# Patient Record
Sex: Female | Born: 1955 | Race: White | Hispanic: No | Smoking: Never smoker
Health system: Southern US, Community
[De-identification: ages and names within clinical notes are randomized; demographics above are authoritative.]

## PROBLEM LIST (undated history)

## (undated) DIAGNOSIS — F32A Depression, unspecified: Secondary | ICD-10-CM

## (undated) DIAGNOSIS — F329 Major depressive disorder, single episode, unspecified: Secondary | ICD-10-CM

## (undated) HISTORY — PX: TONSILLECTOMY: SUR1361

## (undated) HISTORY — PX: SALPINGOOPHORECTOMY: SHX82

---

## 1998-06-25 ENCOUNTER — Other Ambulatory Visit: Admission: RE | Admit: 1998-06-25 | Discharge: 1998-06-25 | Payer: Self-pay | Admitting: *Deleted

## 1999-07-22 ENCOUNTER — Other Ambulatory Visit: Admission: RE | Admit: 1999-07-22 | Discharge: 1999-07-22 | Payer: Self-pay | Admitting: *Deleted

## 2000-08-10 ENCOUNTER — Other Ambulatory Visit: Admission: RE | Admit: 2000-08-10 | Discharge: 2000-08-10 | Payer: Self-pay | Admitting: *Deleted

## 2001-10-24 ENCOUNTER — Other Ambulatory Visit: Admission: RE | Admit: 2001-10-24 | Discharge: 2001-10-24 | Payer: Self-pay | Admitting: *Deleted

## 2002-10-24 ENCOUNTER — Other Ambulatory Visit: Admission: RE | Admit: 2002-10-24 | Discharge: 2002-10-24 | Payer: Self-pay | Admitting: *Deleted

## 2003-04-08 ENCOUNTER — Encounter: Admission: RE | Admit: 2003-04-08 | Discharge: 2003-04-08 | Payer: Self-pay | Admitting: *Deleted

## 2003-11-09 ENCOUNTER — Other Ambulatory Visit: Admission: RE | Admit: 2003-11-09 | Discharge: 2003-11-09 | Payer: Self-pay | Admitting: *Deleted

## 2008-05-27 IMAGING — US US ABDOMEN COMPLETE
1 series · 13 of 25 positions shown · non-contrast
Comparison: NONE

CLINICAL DATA: Right upper quadrant pain. 

ABDOMINAL ULTRASOUND

[Series 1: us abd · 0.30mm/px · 13 of 65 slices shown]
[im 1/65]
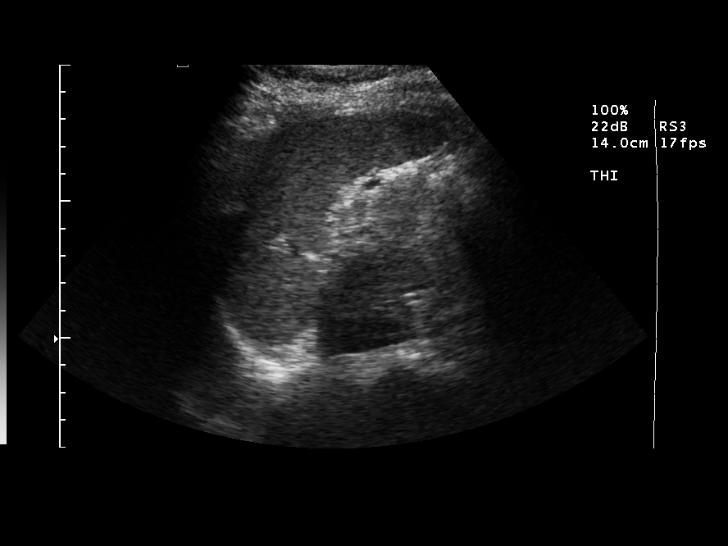
[im 6/65]
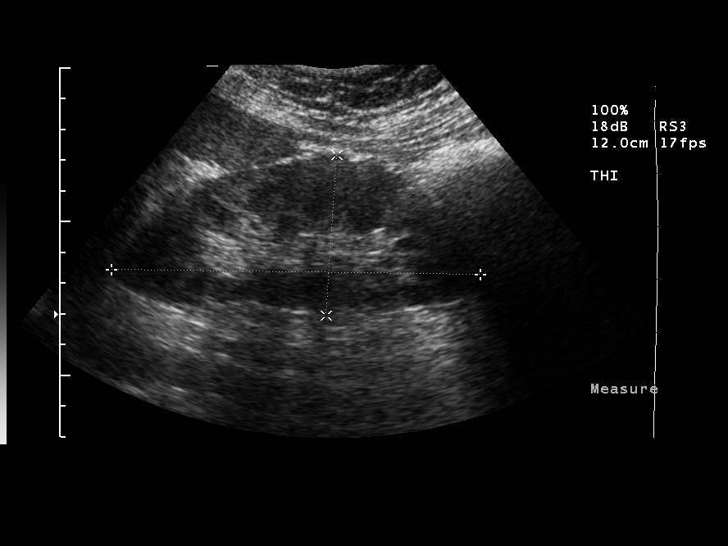
[im 11/65]
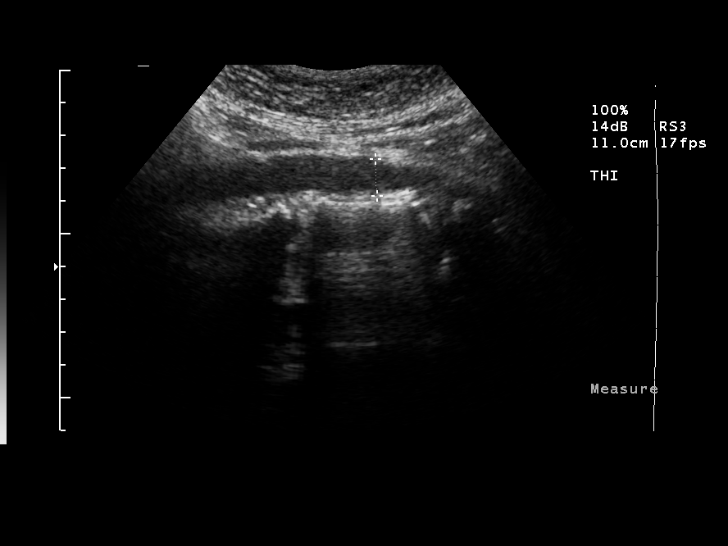
[im 17/65]
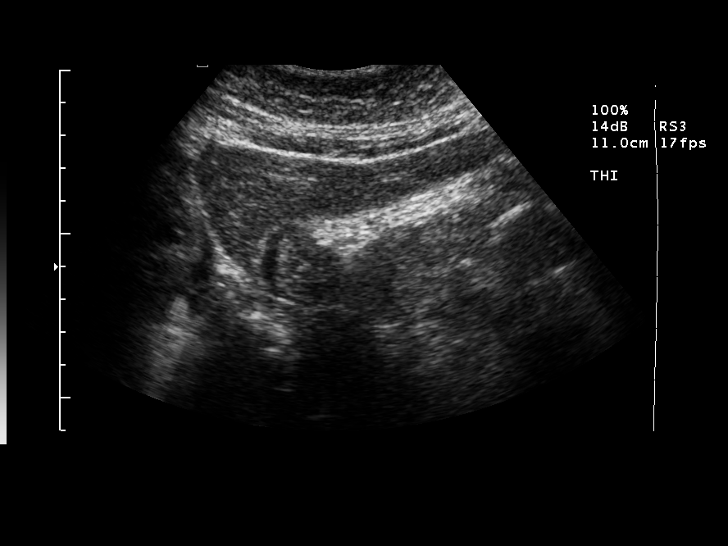
[im 22/65]
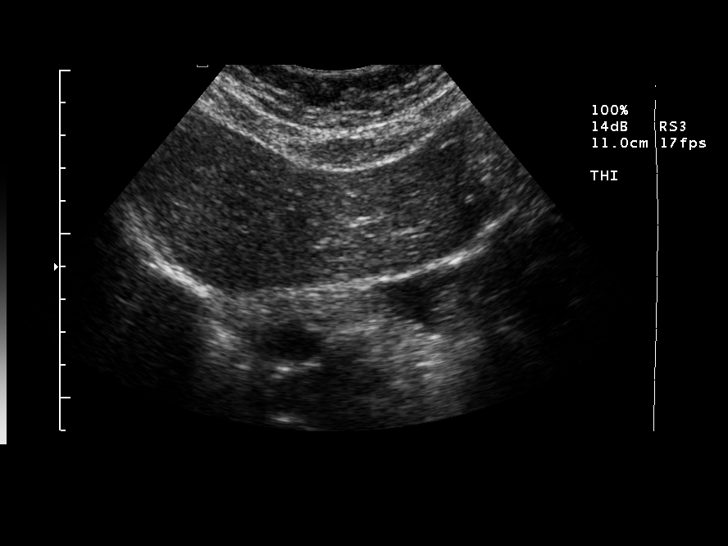
[im 27/65]
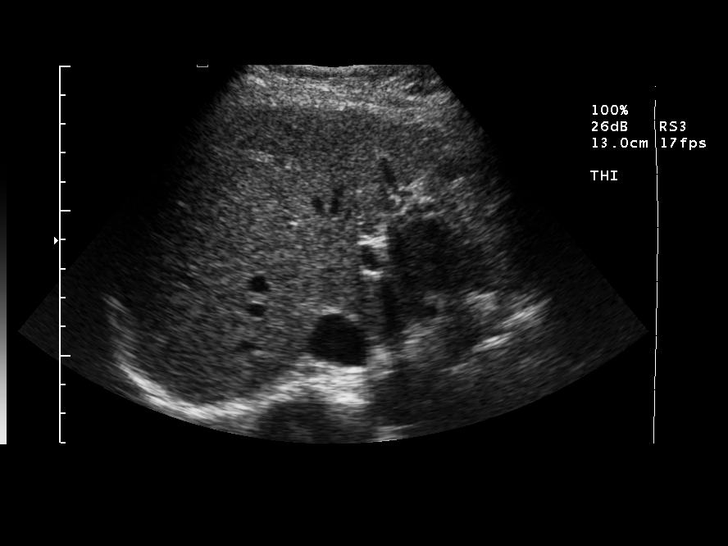
[im 33/65]
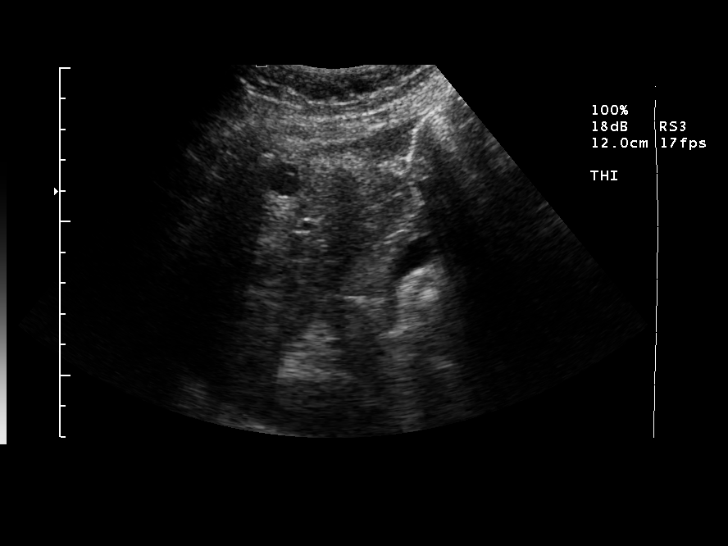
[im 38/65]
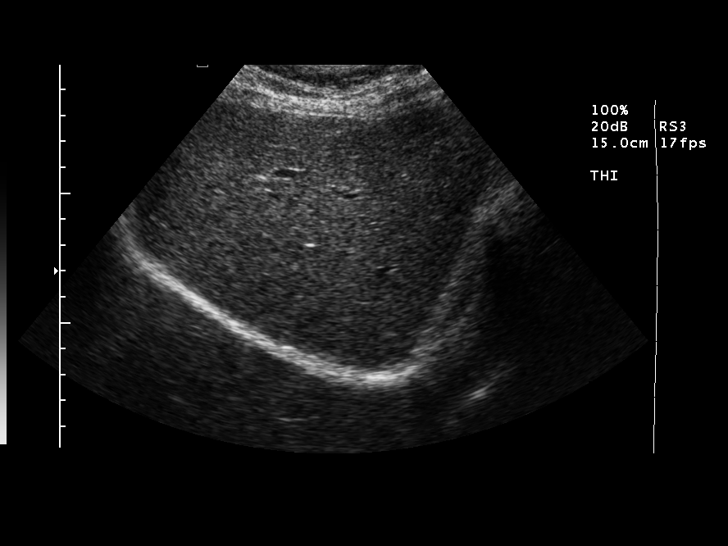
[im 43/65]
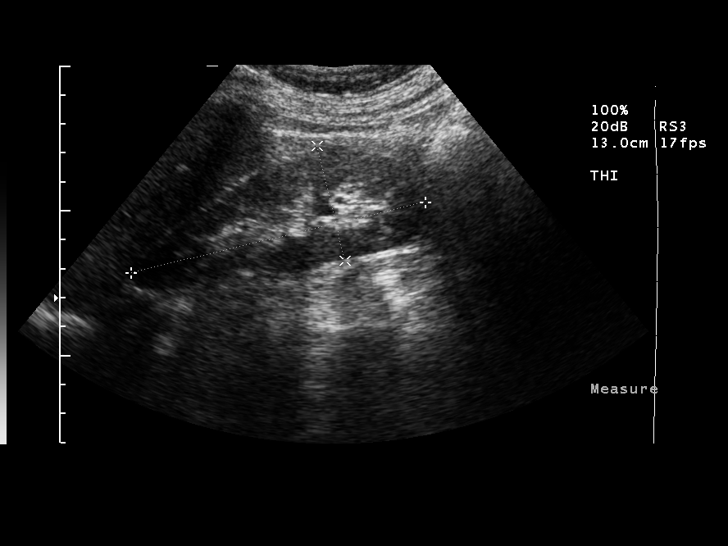
[im 49/65]
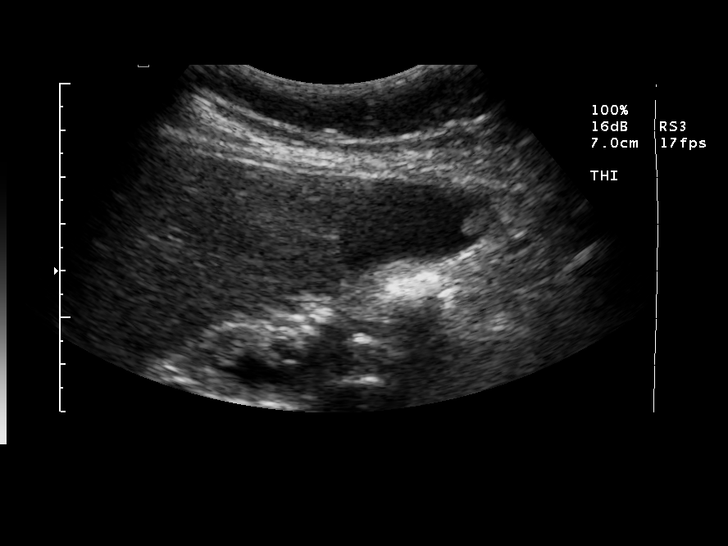
[im 54/65]
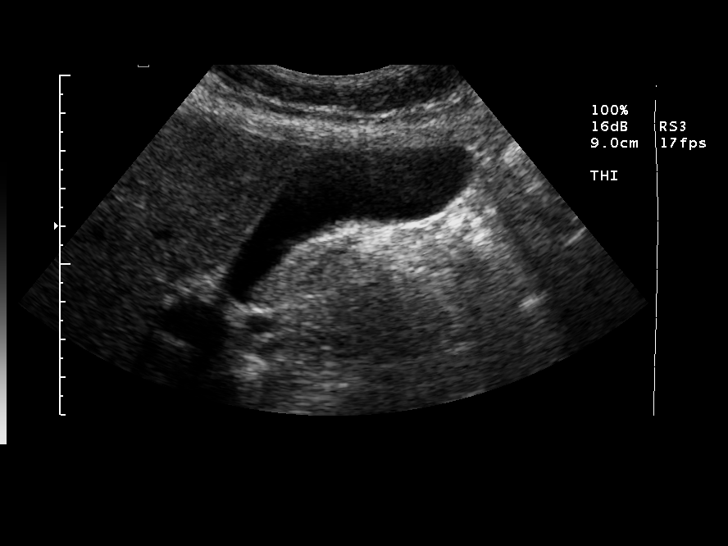
[im 59/65]
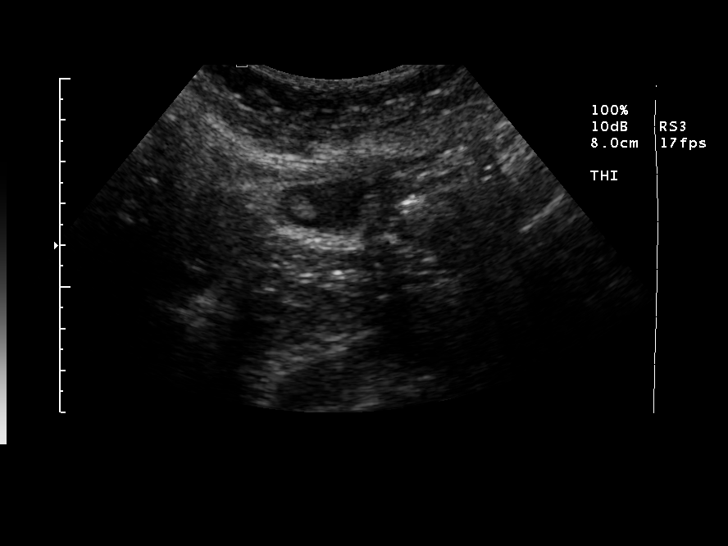
[im 65/65]
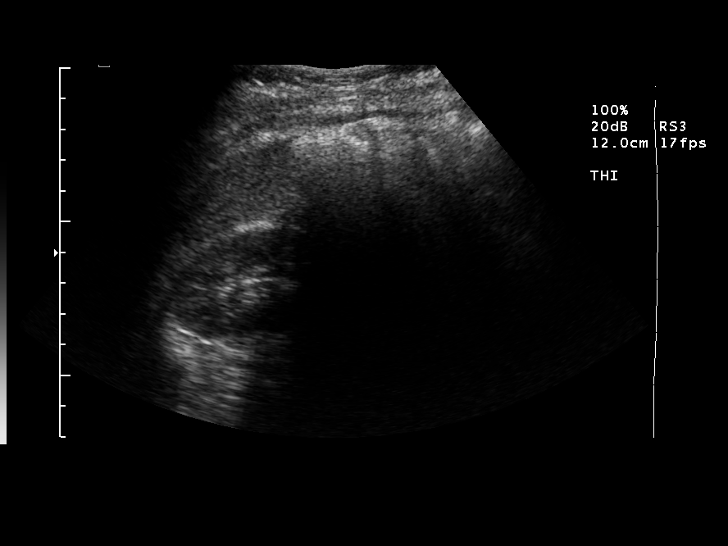

[13 of 25 positions shown; findings below may reference images not displayed]

FINDINGS: Examination of the liver demonstrates normal size and 
echogenicity. Located in the posterior segment of the right lobe 
of the liver, there is a hyperechoic solid nodule measuring 1.0 x 
1.0 x 1.1 cm. Appearances favor a benign hemangioma. Located in 
the medial segment of the left lobe, there is a hypoechoic nodule 
which exhibits acoustic enhancement. Appearances favor a cyst, 
although optimal visualization was difficult due to location. This 
measures 1.2 x 1.1 x 1.7 cm. The pancreas is normal in appearance. 
The abdominal aorta is normal in caliber and echo appearance with 
AP measurements of 1.7 cm proximally, mid 1.2 cm, and distally
cm. Both kidneys are normal in size and echo appearance. 
Examination of the gallbladder demonstrates a wall-adherent 
echogenicity in the gallbladder fundus, measuring 1.0 x 0.6 x
cm. A similar appearing wall-adherent echogenicity is noted near 
the neck of the gallbladder, measuring 0.34 cm. Appearances favor 
polyps. The common duct is non-dilated measuring 0.44 cm. The 
spleen measures 10.3 cm with normal echogenicity.
IMPRESSION: 1-cm hyperechoic right lobe hepatic nodule most 
consistent with a hemangioma. Probable left lobe hepatic cyst. 
Probable gallbladder polyps. I recommend a follow-up study of 
these findings with ultrasound re-evaluation in 6 months. Watanabe 
Kants Za, M.D. electronically reviewed on 11/13/2007 Dict Date: 
11/12/2007  Tran Date: 11/13/2007 CAV  [REDACTED]

## 2008-11-09 ENCOUNTER — Encounter: Admission: RE | Admit: 2008-11-09 | Discharge: 2008-11-09 | Payer: Self-pay | Admitting: Obstetrics and Gynecology

## 2009-11-15 ENCOUNTER — Encounter: Admission: RE | Admit: 2009-11-15 | Discharge: 2009-11-15 | Payer: Self-pay | Admitting: Obstetrics and Gynecology

## 2010-12-08 ENCOUNTER — Other Ambulatory Visit: Payer: Self-pay | Admitting: Obstetrics & Gynecology

## 2010-12-08 DIAGNOSIS — Z1231 Encounter for screening mammogram for malignant neoplasm of breast: Secondary | ICD-10-CM

## 2010-12-15 ENCOUNTER — Ambulatory Visit
Admission: RE | Admit: 2010-12-15 | Discharge: 2010-12-15 | Disposition: A | Discharge status: Home / Self Care | Payer: BC Managed Care – PPO | Source: Ambulatory Visit | Attending: Obstetrics & Gynecology | Admitting: Obstetrics & Gynecology

## 2010-12-15 DIAGNOSIS — Z1231 Encounter for screening mammogram for malignant neoplasm of breast: Secondary | ICD-10-CM

## 2011-12-12 ENCOUNTER — Other Ambulatory Visit: Payer: Self-pay | Admitting: Obstetrics & Gynecology

## 2011-12-12 DIAGNOSIS — Z1231 Encounter for screening mammogram for malignant neoplasm of breast: Secondary | ICD-10-CM

## 2011-12-22 ENCOUNTER — Ambulatory Visit
Admission: RE | Admit: 2011-12-22 | Discharge: 2011-12-22 | Disposition: A | Discharge status: Home / Self Care | Payer: BC Managed Care – PPO | Source: Ambulatory Visit | Attending: Obstetrics & Gynecology | Admitting: Obstetrics & Gynecology

## 2011-12-22 DIAGNOSIS — Z1231 Encounter for screening mammogram for malignant neoplasm of breast: Secondary | ICD-10-CM

## 2012-12-20 ENCOUNTER — Other Ambulatory Visit: Payer: Self-pay

## 2012-12-20 DIAGNOSIS — Z1231 Encounter for screening mammogram for malignant neoplasm of breast: Secondary | ICD-10-CM

## 2013-01-09 ENCOUNTER — Ambulatory Visit
Admission: RE | Admit: 2013-01-09 | Discharge: 2013-01-09 | Disposition: A | Discharge status: Home / Self Care | Payer: BC Managed Care – PPO | Source: Ambulatory Visit

## 2013-01-09 DIAGNOSIS — Z1231 Encounter for screening mammogram for malignant neoplasm of breast: Secondary | ICD-10-CM

## 2013-01-16 ENCOUNTER — Ambulatory Visit: Payer: BC Managed Care – PPO

## 2013-05-07 ENCOUNTER — Other Ambulatory Visit: Payer: Self-pay | Admitting: Internal Medicine

## 2013-05-12 ENCOUNTER — Ambulatory Visit
Admission: RE | Admit: 2013-05-12 | Discharge: 2013-05-12 | Disposition: A | Discharge status: Home / Self Care | Payer: BC Managed Care – PPO | Source: Ambulatory Visit | Attending: Internal Medicine | Admitting: Internal Medicine

## 2013-12-05 ENCOUNTER — Other Ambulatory Visit: Payer: Self-pay

## 2013-12-05 DIAGNOSIS — Z1231 Encounter for screening mammogram for malignant neoplasm of breast: Secondary | ICD-10-CM

## 2013-12-05 DIAGNOSIS — Z853 Personal history of malignant neoplasm of breast: Secondary | ICD-10-CM

## 2013-12-05 DIAGNOSIS — Z803 Family history of malignant neoplasm of breast: Secondary | ICD-10-CM

## 2014-02-06 ENCOUNTER — Ambulatory Visit
Admission: RE | Admit: 2014-02-06 | Discharge: 2014-02-06 | Disposition: A | Discharge status: Home / Self Care | Payer: BC Managed Care – PPO | Source: Ambulatory Visit

## 2014-02-06 DIAGNOSIS — Z803 Family history of malignant neoplasm of breast: Secondary | ICD-10-CM

## 2014-02-06 DIAGNOSIS — Z1231 Encounter for screening mammogram for malignant neoplasm of breast: Secondary | ICD-10-CM

## 2015-05-27 ENCOUNTER — Other Ambulatory Visit: Payer: Self-pay | Admitting: Gastroenterology

## 2015-08-05 ENCOUNTER — Encounter (HOSPITAL_COMMUNITY): Payer: Self-pay | Admitting: *Deleted

## 2015-08-16 ENCOUNTER — Encounter (HOSPITAL_COMMUNITY): Payer: Self-pay

## 2015-08-16 ENCOUNTER — Encounter (HOSPITAL_COMMUNITY)
Admission: RE | Disposition: A | Discharge status: Home / Self Care | Payer: Self-pay | Source: Ambulatory Visit | Attending: Gastroenterology

## 2015-08-16 ENCOUNTER — Ambulatory Visit (HOSPITAL_COMMUNITY): Payer: BC Managed Care – PPO | Admitting: Anesthesiology

## 2015-08-16 ENCOUNTER — Ambulatory Visit (HOSPITAL_COMMUNITY)
Admission: RE | Admit: 2015-08-16 | Discharge: 2015-08-16 | Disposition: A | Discharge status: Home / Self Care | Payer: BC Managed Care – PPO | Source: Ambulatory Visit | Attending: Gastroenterology | Admitting: Gastroenterology

## 2015-08-16 DIAGNOSIS — Z1211 Encounter for screening for malignant neoplasm of colon: Secondary | ICD-10-CM | POA: Insufficient documentation

## 2015-08-16 HISTORY — DX: Major depressive disorder, single episode, unspecified: F32.9

## 2015-08-16 HISTORY — PX: COLONOSCOPY WITH PROPOFOL: SHX5780

## 2015-08-16 HISTORY — DX: Depression, unspecified: F32.A

## 2015-08-16 SURGERY — COLONOSCOPY WITH PROPOFOL
Anesthesia: Monitor Anesthesia Care

## 2015-08-16 MED ORDER — PROPOFOL 10 MG/ML IV BOLUS
INTRAVENOUS | Status: AC
Start: 2015-08-16 — End: 2015-08-16
  Filled 2015-08-16: qty 20

## 2015-08-16 MED ORDER — PROPOFOL 10 MG/ML IV BOLUS
INTRAVENOUS | Status: DC | PRN
  Administered 2015-08-16 (×3): 40 mg via INTRAVENOUS
  Administered 2015-08-16: 20 mg via INTRAVENOUS
  Administered 2015-08-16: 40 mg via INTRAVENOUS
  Administered 2015-08-16: 20 mg via INTRAVENOUS
  Administered 2015-08-16: 40 mg via INTRAVENOUS
  Administered 2015-08-16 (×2): 20 mg via INTRAVENOUS

## 2015-08-16 MED ORDER — LACTATED RINGERS IV SOLN
INTRAVENOUS | Status: DC
  Administered 2015-08-16: 1000 mL via INTRAVENOUS

## 2015-08-16 SURGICAL SUPPLY — 21 items

## 2015-08-16 NOTE — Discharge Instructions (Signed)
Colonoscopy, Care After °Refer to this sheet in the next few weeks. These instructions provide you with information on caring for yourself after your procedure. Your health care provider may also give you more specific instructions. Your treatment has been planned according to current medical practices, but problems sometimes occur. Call your health care provider if you have any problems or questions after your procedure. °WHAT TO EXPECT AFTER THE PROCEDURE  °After your procedure, it is typical to have the following: °· A small amount of blood in your stool. °· Moderate amounts of gas and mild abdominal cramping or bloating. °HOME CARE INSTRUCTIONS °· Do not drive, operate machinery, or sign important documents for 24 hours. °· You may shower and resume your regular physical activities, but move at a slower pace for the first 24 hours. °· Take frequent rest periods for the first 24 hours. °· Walk around or put a warm pack on your abdomen to help reduce abdominal cramping and bloating. °· Drink enough fluids to keep your urine clear or pale yellow. °· You may resume your normal diet as instructed by your health care provider. Avoid heavy or fried foods that are hard to digest. °· Avoid drinking alcohol for 24 hours or as instructed by your health care provider. °· Only take over-the-counter or prescription medicines as directed by your health care provider. °· If a tissue sample (biopsy) was taken during your procedure: °¨ Do not take aspirin or blood thinners for 7 days, or as instructed by your health care provider. °¨ Do not drink alcohol for 7 days, or as instructed by your health care provider. °¨ Eat soft foods for the first 24 hours. °SEEK MEDICAL CARE IF: °You have persistent spotting of blood in your stool 2-3 days after the procedure. °SEEK IMMEDIATE MEDICAL CARE IF: °· You have more than a small spotting of blood in your stool. °· You pass large blood clots in your stool. °· Your abdomen is swollen  (distended). °· You have nausea or vomiting. °· You have a fever. °· You have increasing abdominal pain that is not relieved with medicine. °  °This information is not intended to replace advice given to you by your health care provider. Make sure you discuss any questions you have with your health care provider. °  °Document Released: 05/23/2004 Document Revised: 07/30/2013 Document Reviewed: 06/16/2013 °Elsevier Interactive Patient Education ©2016 [employer] ° °

## 2015-08-16 NOTE — H&P (Signed)
  Procedure: Screening colonoscopy. 03/01/2005 normal screening colonoscopy performed  History: The patient is a 59 year old female born 1956/06/09. She is scheduled to undergo a repeat screening colonoscopy today.  Past medical history: Depression. Allergic rhinitis. Benign hepatic cyst. Tonsillectomy. Right ovary removed surgically.  Medication allergies: Codeine  Exam: The patient is alert and lying comfortably on the endoscopy stretcher. Abdomen is soft and nontender to palpation. Cardiac exam reveals a regular rhythm. Lungs are clear to auscultation.  Plan: Proceed with screening colonoscopy. The patient does not want to receive intravenous blood products

## 2015-08-16 NOTE — Transfer of Care (Signed)
Immediate Anesthesia Transfer of Care Note  Patient: Victoria SchimkeDeborah P Cannon  Procedure(s) Performed: Procedure(s): COLONOSCOPY WITH PROPOFOL (N/A)  Patient Location: PACU and Endoscopy Unit  Anesthesia Type:MAC  Level of Consciousness: awake, oriented, patient cooperative, lethargic and responds to stimulation  Airway & Oxygen Therapy: Patient Spontanous Breathing and Patient connected to face mask oxygen  Post-op Assessment: Report given to RN, Post -op Vital signs reviewed and stable and Patient moving all extremities  Post vital signs: Reviewed and stable  Last Vitals:  Filed Vitals:   08/16/15 0746  BP: 101/45  Pulse: 64  Temp: 36.9 C  Resp: 12    Complications: No apparent anesthesia complications

## 2015-08-16 NOTE — Anesthesia Postprocedure Evaluation (Signed)
  Anesthesia Post-op Note  Patient: Victoria SchimkeDeborah P Toohey  Procedure(s) Performed: Procedure(s) (LRB): COLONOSCOPY WITH PROPOFOL (N/A)  Patient Location: PACU  Anesthesia Type: MAC  Level of Consciousness: awake and alert   Airway and Oxygen Therapy: Patient Spontanous Breathing  Post-op Pain: mild  Post-op Assessment: Post-op Vital signs reviewed, Patient's Cardiovascular Status Stable, Respiratory Function Stable, Patent Airway and No signs of Nausea or vomiting  Last Vitals:  Filed Vitals:   08/16/15 0950  BP: 105/51  Pulse: 73  Temp:   Resp: 23    Post-op Vital Signs: stable   Complications: No apparent anesthesia complications

## 2015-08-16 NOTE — Anesthesia Preprocedure Evaluation (Addendum)
Anesthesia Evaluation  Patient identified by MRN, date of birth, ID band Patient awake    Reviewed: Allergy & Precautions, NPO status , Patient's Chart, lab work & pertinent test results  Airway Mallampati: II  TM Distance: >3 FB Neck ROM: Full    Dental no notable dental hx.    Pulmonary neg pulmonary ROS,    Pulmonary exam normal breath sounds clear to auscultation       Cardiovascular negative cardio ROS Normal cardiovascular exam Rhythm:Regular Rate:Normal     Neuro/Psych PSYCHIATRIC DISORDERS Depression negative neurological ROS     GI/Hepatic negative GI ROS, Neg liver ROS,   Endo/Other  negative endocrine ROS  Renal/GU negative Renal ROS  negative genitourinary   Musculoskeletal negative musculoskeletal ROS (+)   Abdominal   Peds negative pediatric ROS (+)  Hematology negative hematology ROS (+)   Anesthesia Other Findings   Reproductive/Obstetrics negative OB ROS                             Anesthesia Physical Anesthesia Plan  ASA: II  Anesthesia Plan: MAC   Post-op Pain Management:    Induction: Intravenous  Airway Management Planned: Natural Airway  Additional Equipment:   Intra-op Plan:   Post-operative Plan:   Informed Consent: I have reviewed the patients History and Physical, chart, labs and discussed the procedure including the risks, benefits and alternatives for the proposed anesthesia with the patient or authorized representative who has indicated his/her understanding and acceptance.   Dental advisory given  Plan Discussed with: CRNA  Anesthesia Plan Comments: ([employer]. Blood refusal paperwork on chart.)       Anesthesia Quick Evaluation

## 2015-08-16 NOTE — Op Note (Signed)
Procedure: Screening colonoscopy. Normal screening colonoscopy performed on 03/01/2005  Endoscopist: Danise EdgeMartin Iretta Mangrum  Premedication: Propofol administered by anesthesia  Procedure: The patient was placed in the left lateral decubitus position. Anal inspection and digital rectal exam were normal. The Pentax pediatric colonoscope was introduced into the rectum and advanced to the cecum. A normal-appearing appendiceal orifice and ileocecal valve were identified. Colonic preparation for the exam today was good. Withdrawal time was 10 minutes  Rectum. Normal. Retroflexed view of the distal rectum was normal  Sigmoid colon and descending colon. Normal  Splenic flexure. Normal  Transverse colon. Normal  Hepatic flexure. Normal  Ascending colon. Normal  Cecum and ileocecal valve. Normal  Assessment: Normal screening colonoscopy  Recommendation: Schedule repeat screening colonoscopy in 10 years

## 2015-08-17 ENCOUNTER — Encounter (HOSPITAL_COMMUNITY): Payer: Self-pay | Admitting: Gastroenterology

## 2016-03-08 ENCOUNTER — Other Ambulatory Visit: Payer: Self-pay | Admitting: Obstetrics & Gynecology

## 2016-03-08 DIAGNOSIS — R928 Other abnormal and inconclusive findings on diagnostic imaging of breast: Secondary | ICD-10-CM

## 2016-03-14 ENCOUNTER — Ambulatory Visit
Admission: RE | Admit: 2016-03-14 | Discharge: 2016-03-14 | Disposition: A | Discharge status: Home / Self Care | Payer: BC Managed Care – PPO | Source: Ambulatory Visit | Attending: Obstetrics & Gynecology | Admitting: Obstetrics & Gynecology

## 2016-03-14 DIAGNOSIS — R928 Other abnormal and inconclusive findings on diagnostic imaging of breast: Secondary | ICD-10-CM

## 2019-01-07 ENCOUNTER — Other Ambulatory Visit: Payer: Self-pay | Admitting: Internal Medicine

## 2019-01-07 DIAGNOSIS — M8580 Other specified disorders of bone density and structure, unspecified site: Secondary | ICD-10-CM

## 2019-03-12 ENCOUNTER — Other Ambulatory Visit: Payer: BC Managed Care – PPO

## 2019-05-12 ENCOUNTER — Ambulatory Visit
Admission: RE | Admit: 2019-05-12 | Discharge: 2019-05-12 | Disposition: A | Discharge status: Home / Self Care | Payer: Medicare Other | Source: Ambulatory Visit | Attending: Internal Medicine | Admitting: Internal Medicine

## 2019-05-12 ENCOUNTER — Other Ambulatory Visit: Payer: Self-pay

## 2019-05-12 DIAGNOSIS — M8580 Other specified disorders of bone density and structure, unspecified site: Secondary | ICD-10-CM

## 2019-12-06 ENCOUNTER — Ambulatory Visit: Payer: Medicare PPO | Attending: Internal Medicine

## 2019-12-06 DIAGNOSIS — Z23 Encounter for immunization: Secondary | ICD-10-CM | POA: Insufficient documentation

## 2019-12-06 NOTE — Progress Notes (Signed)
   Covid-19 Vaccination Clinic  Name:  Victoria Cannon    MRN: [id number] DOB: [birth year]  12/06/2019  Victoria Cannon was observed post Covid-19 immunization for 15 minutes without incidence. She was provided with Vaccine Information Sheet and instruction to access the V-Safe system.   Victoria Cannon was instructed to call 911 with any severe reactions post vaccine: Marland Kitchen Difficulty breathing  . Swelling of your face and throat  . A fast heartbeat  . A bad rash all over your body  . Dizziness and weakness    Immunizations Administered    Name Date Dose VIS Date Route   Pfizer COVID-19 Vaccine 12/06/2019  2:24 PM 0.3 mL 10/03/2019 Intramuscular   Manufacturer: [employer], [employer]   Lot: [id number]   NDC: [address]-3

## 2019-12-27 ENCOUNTER — Ambulatory Visit: Payer: Medicare PPO | Attending: Internal Medicine

## 2019-12-27 DIAGNOSIS — Z23 Encounter for immunization: Secondary | ICD-10-CM

## 2019-12-27 NOTE — Progress Notes (Signed)
   Covid-19 Vaccination Clinic  Name:  Victoria Cannon    MRN: [id number] DOB: 1956/01/02  12/27/2019  [name] was observed post Covid-19 immunization for 15 minutes without incident. She was provided with Vaccine Information Sheet and instruction to access the V-Safe system.   [name] was instructed to call 911 with any severe reactions post vaccine: Marland Kitchen Difficulty breathing  . Swelling of face and throat  . A fast heartbeat  . A bad rash all over body  . Dizziness and weakness   Immunizations Administered    Name Date Dose VIS Date Route   Pfizer COVID-19 Vaccine 12/27/2019  2:21 PM 0.3 mL 10/03/2019 Intramuscular   Manufacturer: [employer], [employer]   Lot: [id number]   NDC: [address]-3

## 2020-08-07 ENCOUNTER — Ambulatory Visit: Payer: Medicare PPO | Attending: Internal Medicine

## 2020-08-07 ENCOUNTER — Other Ambulatory Visit: Payer: Self-pay

## 2020-08-07 DIAGNOSIS — Z23 Encounter for immunization: Secondary | ICD-10-CM

## 2020-08-07 NOTE — Progress Notes (Signed)
   Covid-19 Vaccination Clinic  Name:  KARISHMA UNREIN    MRN: [id number] DOB: Oct 02, 1956  08/07/2020  Ms. Donnan was observed post Covid-19 immunization for 15 minutes without incident. She was provided with Vaccine Information Sheet and instruction to access the V-Safe system.   Ms. Johndrow was instructed to call 911 with any severe reactions post vaccine: Marland Kitchen Difficulty breathing  . Swelling of face and throat  . A fast heartbeat  . A bad rash all over body  . Dizziness and weakness

## 2020-10-25 DIAGNOSIS — D1723 Benign lipomatous neoplasm of skin and subcutaneous tissue of right leg: Secondary | ICD-10-CM | POA: Diagnosis not present

## 2020-10-25 DIAGNOSIS — D2339 Other benign neoplasm of skin of other parts of face: Secondary | ICD-10-CM | POA: Diagnosis not present

## 2020-10-25 DIAGNOSIS — D485 Neoplasm of uncertain behavior of skin: Secondary | ICD-10-CM | POA: Diagnosis not present

## 2020-10-25 DIAGNOSIS — L570 Actinic keratosis: Secondary | ICD-10-CM | POA: Diagnosis not present

## 2020-10-25 DIAGNOSIS — L578 Other skin changes due to chronic exposure to nonionizing radiation: Secondary | ICD-10-CM | POA: Diagnosis not present

## 2020-10-29 DIAGNOSIS — G4733 Obstructive sleep apnea (adult) (pediatric): Secondary | ICD-10-CM | POA: Diagnosis not present

## 2020-11-09 DIAGNOSIS — H25813 Combined forms of age-related cataract, bilateral: Secondary | ICD-10-CM | POA: Diagnosis not present

## 2021-01-12 DIAGNOSIS — M810 Age-related osteoporosis without current pathological fracture: Secondary | ICD-10-CM | POA: Diagnosis not present

## 2021-01-12 DIAGNOSIS — Z23 Encounter for immunization: Secondary | ICD-10-CM | POA: Diagnosis not present

## 2021-01-12 DIAGNOSIS — E785 Hyperlipidemia, unspecified: Secondary | ICD-10-CM | POA: Diagnosis not present

## 2021-01-12 DIAGNOSIS — R7303 Prediabetes: Secondary | ICD-10-CM | POA: Diagnosis not present

## 2021-01-12 DIAGNOSIS — F331 Major depressive disorder, recurrent, moderate: Secondary | ICD-10-CM | POA: Diagnosis not present

## 2021-01-12 DIAGNOSIS — Z0000 Encounter for general adult medical examination without abnormal findings: Secondary | ICD-10-CM | POA: Diagnosis not present

## 2021-01-12 DIAGNOSIS — J309 Allergic rhinitis, unspecified: Secondary | ICD-10-CM | POA: Diagnosis not present

## 2021-01-12 DIAGNOSIS — Z1389 Encounter for screening for other disorder: Secondary | ICD-10-CM | POA: Diagnosis not present

## 2021-01-21 ENCOUNTER — Ambulatory Visit: Payer: Medicare PPO | Attending: Otolaryngology

## 2021-01-21 DIAGNOSIS — G4730 Sleep apnea, unspecified: Secondary | ICD-10-CM | POA: Diagnosis not present

## 2021-01-21 DIAGNOSIS — R0683 Snoring: Secondary | ICD-10-CM | POA: Diagnosis not present

## 2021-01-21 DIAGNOSIS — G4761 Periodic limb movement disorder: Secondary | ICD-10-CM | POA: Diagnosis not present

## 2021-01-21 DIAGNOSIS — G4733 Obstructive sleep apnea (adult) (pediatric): Secondary | ICD-10-CM | POA: Insufficient documentation

## 2021-01-24 ENCOUNTER — Other Ambulatory Visit: Payer: Self-pay

## 2021-05-04 DIAGNOSIS — L292 Pruritus vulvae: Secondary | ICD-10-CM | POA: Diagnosis not present

## 2021-05-04 DIAGNOSIS — Z1231 Encounter for screening mammogram for malignant neoplasm of breast: Secondary | ICD-10-CM | POA: Diagnosis not present

## 2021-05-04 DIAGNOSIS — Z6821 Body mass index (BMI) 21.0-21.9, adult: Secondary | ICD-10-CM | POA: Diagnosis not present

## 2021-05-04 DIAGNOSIS — M810 Age-related osteoporosis without current pathological fracture: Secondary | ICD-10-CM | POA: Diagnosis not present

## 2021-05-04 DIAGNOSIS — Z01419 Encounter for gynecological examination (general) (routine) without abnormal findings: Secondary | ICD-10-CM | POA: Diagnosis not present

## 2021-05-04 DIAGNOSIS — Z124 Encounter for screening for malignant neoplasm of cervix: Secondary | ICD-10-CM | POA: Diagnosis not present

## 2021-05-04 DIAGNOSIS — Z01411 Encounter for gynecological examination (general) (routine) with abnormal findings: Secondary | ICD-10-CM | POA: Diagnosis not present

## 2021-05-04 DIAGNOSIS — N952 Postmenopausal atrophic vaginitis: Secondary | ICD-10-CM | POA: Diagnosis not present

## 2021-05-09 ENCOUNTER — Other Ambulatory Visit: Payer: Self-pay | Admitting: Obstetrics & Gynecology

## 2021-05-09 DIAGNOSIS — M810 Age-related osteoporosis without current pathological fracture: Secondary | ICD-10-CM

## 2021-05-31 DIAGNOSIS — M810 Age-related osteoporosis without current pathological fracture: Secondary | ICD-10-CM | POA: Diagnosis not present

## 2021-10-25 DIAGNOSIS — L400 Psoriasis vulgaris: Secondary | ICD-10-CM | POA: Diagnosis not present

## 2021-10-25 DIAGNOSIS — Z872 Personal history of diseases of the skin and subcutaneous tissue: Secondary | ICD-10-CM | POA: Diagnosis not present

## 2021-10-25 DIAGNOSIS — L578 Other skin changes due to chronic exposure to nonionizing radiation: Secondary | ICD-10-CM | POA: Diagnosis not present

## 2021-11-08 ENCOUNTER — Other Ambulatory Visit: Payer: Medicare PPO

## 2021-12-08 DIAGNOSIS — L400 Psoriasis vulgaris: Secondary | ICD-10-CM | POA: Diagnosis not present

## 2022-01-18 DIAGNOSIS — E559 Vitamin D deficiency, unspecified: Secondary | ICD-10-CM | POA: Diagnosis not present

## 2022-01-18 DIAGNOSIS — E785 Hyperlipidemia, unspecified: Secondary | ICD-10-CM | POA: Diagnosis not present

## 2022-01-18 DIAGNOSIS — R7303 Prediabetes: Secondary | ICD-10-CM | POA: Diagnosis not present

## 2022-01-18 DIAGNOSIS — M810 Age-related osteoporosis without current pathological fracture: Secondary | ICD-10-CM | POA: Diagnosis not present

## 2022-01-18 DIAGNOSIS — D72819 Decreased white blood cell count, unspecified: Secondary | ICD-10-CM | POA: Diagnosis not present

## 2022-01-18 DIAGNOSIS — F331 Major depressive disorder, recurrent, moderate: Secondary | ICD-10-CM | POA: Diagnosis not present

## 2022-01-18 DIAGNOSIS — J309 Allergic rhinitis, unspecified: Secondary | ICD-10-CM | POA: Diagnosis not present

## 2022-01-18 DIAGNOSIS — Z0000 Encounter for general adult medical examination without abnormal findings: Secondary | ICD-10-CM | POA: Diagnosis not present

## 2022-02-16 DIAGNOSIS — H25813 Combined forms of age-related cataract, bilateral: Secondary | ICD-10-CM | POA: Diagnosis not present

## 2022-04-18 DIAGNOSIS — H6981 Other specified disorders of Eustachian tube, right ear: Secondary | ICD-10-CM | POA: Diagnosis not present

## 2022-04-18 DIAGNOSIS — H8192 Unspecified disorder of vestibular function, left ear: Secondary | ICD-10-CM | POA: Diagnosis not present

## 2022-05-10 DIAGNOSIS — Z1231 Encounter for screening mammogram for malignant neoplasm of breast: Secondary | ICD-10-CM | POA: Diagnosis not present

## 2022-05-10 DIAGNOSIS — Z6820 Body mass index (BMI) 20.0-20.9, adult: Secondary | ICD-10-CM | POA: Diagnosis not present

## 2022-05-10 DIAGNOSIS — Z0142 Encounter for cervical smear to confirm findings of recent normal smear following initial abnormal smear: Secondary | ICD-10-CM | POA: Diagnosis not present

## 2022-05-10 DIAGNOSIS — Z01411 Encounter for gynecological examination (general) (routine) with abnormal findings: Secondary | ICD-10-CM | POA: Diagnosis not present

## 2022-05-10 DIAGNOSIS — N952 Postmenopausal atrophic vaginitis: Secondary | ICD-10-CM | POA: Diagnosis not present

## 2022-05-10 DIAGNOSIS — M810 Age-related osteoporosis without current pathological fracture: Secondary | ICD-10-CM | POA: Diagnosis not present

## 2022-05-10 DIAGNOSIS — Z124 Encounter for screening for malignant neoplasm of cervix: Secondary | ICD-10-CM | POA: Diagnosis not present

## 2022-05-10 DIAGNOSIS — Z01419 Encounter for gynecological examination (general) (routine) without abnormal findings: Secondary | ICD-10-CM | POA: Diagnosis not present

## 2022-10-24 DIAGNOSIS — M9901 Segmental and somatic dysfunction of cervical region: Secondary | ICD-10-CM | POA: Diagnosis not present

## 2022-10-24 DIAGNOSIS — M5413 Radiculopathy, cervicothoracic region: Secondary | ICD-10-CM | POA: Diagnosis not present

## 2022-10-26 DIAGNOSIS — L400 Psoriasis vulgaris: Secondary | ICD-10-CM | POA: Diagnosis not present

## 2022-10-26 DIAGNOSIS — L578 Other skin changes due to chronic exposure to nonionizing radiation: Secondary | ICD-10-CM | POA: Diagnosis not present

## 2022-10-26 DIAGNOSIS — M9901 Segmental and somatic dysfunction of cervical region: Secondary | ICD-10-CM | POA: Diagnosis not present

## 2022-10-26 DIAGNOSIS — Z872 Personal history of diseases of the skin and subcutaneous tissue: Secondary | ICD-10-CM | POA: Diagnosis not present

## 2022-10-26 DIAGNOSIS — M5413 Radiculopathy, cervicothoracic region: Secondary | ICD-10-CM | POA: Diagnosis not present

## 2022-10-30 DIAGNOSIS — M9901 Segmental and somatic dysfunction of cervical region: Secondary | ICD-10-CM | POA: Diagnosis not present

## 2022-10-30 DIAGNOSIS — M5413 Radiculopathy, cervicothoracic region: Secondary | ICD-10-CM | POA: Diagnosis not present

## 2022-11-02 DIAGNOSIS — M5413 Radiculopathy, cervicothoracic region: Secondary | ICD-10-CM | POA: Diagnosis not present

## 2022-11-02 DIAGNOSIS — M9901 Segmental and somatic dysfunction of cervical region: Secondary | ICD-10-CM | POA: Diagnosis not present

## 2022-11-07 DIAGNOSIS — M9901 Segmental and somatic dysfunction of cervical region: Secondary | ICD-10-CM | POA: Diagnosis not present

## 2022-11-07 DIAGNOSIS — M5413 Radiculopathy, cervicothoracic region: Secondary | ICD-10-CM | POA: Diagnosis not present

## 2022-11-23 DIAGNOSIS — M9901 Segmental and somatic dysfunction of cervical region: Secondary | ICD-10-CM | POA: Diagnosis not present

## 2022-11-23 DIAGNOSIS — M5413 Radiculopathy, cervicothoracic region: Secondary | ICD-10-CM | POA: Diagnosis not present

## 2023-01-24 DIAGNOSIS — Z1389 Encounter for screening for other disorder: Secondary | ICD-10-CM | POA: Diagnosis not present

## 2023-01-24 DIAGNOSIS — R7303 Prediabetes: Secondary | ICD-10-CM | POA: Diagnosis not present

## 2023-01-24 DIAGNOSIS — R69 Illness, unspecified: Secondary | ICD-10-CM | POA: Diagnosis not present

## 2023-01-24 DIAGNOSIS — Z0000 Encounter for general adult medical examination without abnormal findings: Secondary | ICD-10-CM | POA: Diagnosis not present

## 2023-01-24 DIAGNOSIS — E785 Hyperlipidemia, unspecified: Secondary | ICD-10-CM | POA: Diagnosis not present

## 2023-01-24 DIAGNOSIS — M810 Age-related osteoporosis without current pathological fracture: Secondary | ICD-10-CM | POA: Diagnosis not present

## 2023-01-24 DIAGNOSIS — J310 Chronic rhinitis: Secondary | ICD-10-CM | POA: Diagnosis not present

## 2023-02-27 DIAGNOSIS — H25813 Combined forms of age-related cataract, bilateral: Secondary | ICD-10-CM | POA: Diagnosis not present

## 2023-05-18 DIAGNOSIS — Z1231 Encounter for screening mammogram for malignant neoplasm of breast: Secondary | ICD-10-CM | POA: Diagnosis not present

## 2023-05-18 DIAGNOSIS — Z0000 Encounter for general adult medical examination without abnormal findings: Secondary | ICD-10-CM | POA: Diagnosis not present

## 2023-05-18 DIAGNOSIS — M436 Torticollis: Secondary | ICD-10-CM | POA: Diagnosis not present

## 2023-05-18 DIAGNOSIS — Z01411 Encounter for gynecological examination (general) (routine) with abnormal findings: Secondary | ICD-10-CM | POA: Diagnosis not present

## 2023-05-18 DIAGNOSIS — Z01419 Encounter for gynecological examination (general) (routine) without abnormal findings: Secondary | ICD-10-CM | POA: Diagnosis not present

## 2023-05-18 DIAGNOSIS — Z6821 Body mass index (BMI) 21.0-21.9, adult: Secondary | ICD-10-CM | POA: Diagnosis not present

## 2023-05-18 DIAGNOSIS — M810 Age-related osteoporosis without current pathological fracture: Secondary | ICD-10-CM | POA: Diagnosis not present

## 2023-05-18 DIAGNOSIS — Z124 Encounter for screening for malignant neoplasm of cervix: Secondary | ICD-10-CM | POA: Diagnosis not present

## 2023-06-12 ENCOUNTER — Emergency Department
Admission: EM | Admit: 2023-06-12 | Discharge: 2023-06-13 | Disposition: A | Discharge status: Home / Self Care | Payer: Medicare HMO | Attending: Emergency Medicine | Admitting: Emergency Medicine

## 2023-06-12 ENCOUNTER — Emergency Department: Payer: Medicare HMO

## 2023-06-12 ENCOUNTER — Other Ambulatory Visit: Payer: Self-pay

## 2023-06-12 DIAGNOSIS — R1010 Upper abdominal pain, unspecified: Secondary | ICD-10-CM | POA: Insufficient documentation

## 2023-06-12 DIAGNOSIS — R0789 Other chest pain: Secondary | ICD-10-CM | POA: Diagnosis not present

## 2023-06-12 DIAGNOSIS — R079 Chest pain, unspecified: Secondary | ICD-10-CM | POA: Diagnosis not present

## 2023-06-12 DIAGNOSIS — M549 Dorsalgia, unspecified: Secondary | ICD-10-CM | POA: Diagnosis not present

## 2023-06-12 LAB — CBC
HCT: 38.5 % (ref 36.0–46.0)
Hemoglobin: 12.6 g/dL (ref 12.0–15.0)
MCH: 30 pg (ref 26.0–34.0)
MCHC: 32.7 g/dL (ref 30.0–36.0)
MCV: 91.7 fL (ref 80.0–100.0)
Platelets: 181 10*3/uL (ref 150–400)
RBC: 4.2 MIL/uL (ref 3.87–5.11)
RDW: 13.6 % (ref 11.5–15.5)
WBC: 4 10*3/uL (ref 4.0–10.5)
nRBC: 0 % (ref 0.0–0.2)

## 2023-06-12 LAB — BASIC METABOLIC PANEL
Anion gap: 7 (ref 5–15)
BUN: 23 mg/dL (ref 8–23)
CO2: 26 mmol/L (ref 22–32)
Calcium: 9 mg/dL (ref 8.9–10.3)
Chloride: 106 mmol/L (ref 98–111)
Creatinine, Ser: 0.72 mg/dL (ref 0.44–1.00)
GFR, Estimated: >60 mL/min (ref >60)
Glucose, Bld: 93 mg/dL (ref 70–99)
Potassium: 3.8 mmol/L (ref 3.5–5.1)
Sodium: 139 mmol/L (ref 135–145)

## 2023-06-12 LAB — TROPONIN I (HIGH SENSITIVITY): Troponin I (High Sensitivity): 2 ng/L (ref <18)

## 2023-06-12 NOTE — ED Triage Notes (Signed)
Pt presents to ER with c/o chest pain that started around 2115 tonight when she was walking out of church.  Pt states pain only lasted around 15 minutes, and is currently gone.  Pt states pain was in center of chest and radiated up left side of her neck.  Denies any dizziness, n/v, or diff breathing.  Pt denies hx of heart disease or other medical issues.  Pt is otherwise A&O x4 and in NA at this time.

## 2023-06-13 DIAGNOSIS — R0789 Other chest pain: Secondary | ICD-10-CM | POA: Diagnosis not present

## 2023-06-13 LAB — TROPONIN I (HIGH SENSITIVITY): Troponin I (High Sensitivity): 3 ng/L (ref <18)

## 2023-06-13 LAB — HEPATIC FUNCTION PANEL
ALT: 26 U/L (ref 0–44)
AST: 25 U/L (ref 15–41)
Albumin: 4.1 g/dL (ref 3.5–5.0)
Alkaline Phosphatase: 45 U/L (ref 38–126)
Bilirubin, Direct: <0.1 mg/dL (ref 0.0–0.2)
Total Bilirubin: 0.7 mg/dL (ref 0.3–1.2)
Total Protein: 7 g/dL (ref 6.5–8.1)

## 2023-06-13 LAB — LIPASE, BLOOD: Lipase: 42 U/L (ref 11–51)

## 2023-06-13 NOTE — ED Provider Notes (Signed)
[facility] Provider Note    Event Date/Time   First MD Initiated Contact with Patient 06/13/23 0008     (approximate)   History   Chest Pain   HPI  Victoria Cannon is a 67 y.o. female with history of depression and family history of cardiac disease who presents to the emergency department with complaints of of 20 to 30 minutes of chest pressure that came on while at rest tonight.  States the pain went up into the right lower neck.  No associated shortness of breath, nausea, vomiting, diaphoresis or dizziness.  No fevers or cough.  No lower extremity swelling or pain.  She does note that recently she has had some mid to left-sided right back pain after a trip to [place].  She states some of that pain goes into the upper abdomen.  She still has her gallbladder.  She states that that pain is worse with palpation in her back and does get better after she is seeing her chiropractor but then will come back.  She states on the drive to [place] they did get out every 3 hours to walk.  No previous history of PE or DVT.   History provided by patient, friend.    Past Medical History:  Diagnosis Date   Depression     Past Surgical History:  Procedure Laterality Date   COLONOSCOPY WITH PROPOFOL N/A 08/16/2015   Procedure: COLONOSCOPY WITH PROPOFOL;  Surgeon: Charolett Bumpers, MD;  Location: [facility] ENDOSCOPY;  Service: Endoscopy;  Laterality: N/A;   SALPINGOOPHORECTOMY     TONSILLECTOMY      MEDICATIONS:  Prior to Admission medications   Medication Sig Start Date End Date Taking? Authorizing Provider  Calcium Carbonate-Vitamin D (CALCIUM-D PO) Take 1 tablet by mouth 3 (three) times daily.    [provider]  cetirizine (ZYRTEC) 10 MG tablet Take 10 mg by mouth daily.    [provider]  mometasone (NASONEX) 50 MCG/ACT nasal spray Place 2 sprays into the nose daily.    [provider]  Multiple Vitamin (MULTIVITAMIN WITH MINERALS)  TABS tablet Take 1 tablet by mouth daily.    [provider]  Omega-3 Fatty Acids (FISH OIL PO) Take 1 capsule by mouth 2 (two) times daily.    [provider]  pseudoephedrine (SUDAFED) 120 MG 12 hr tablet Take 120 mg by mouth daily as needed for congestion.    [provider]  raloxifene (EVISTA) 60 MG tablet Take 60 mg by mouth daily.    [provider]  sertraline (ZOLOFT) 100 MG tablet Take 75 mg by mouth daily.    [provider]    Physical Exam   Triage Vital Signs: ED Triage Vitals  Encounter Vitals Group     BP 06/12/23 2146 132/60     Systolic BP Percentile --      Diastolic BP Percentile --      Pulse Rate 06/12/23 2146 (!) 57     Resp 06/12/23 2146 17     Temp 06/12/23 2146 98.2 F (36.8 C)     Temp Source 06/12/23 2146 Oral     SpO2 06/12/23 2146 97 %     Weight 06/12/23 2144 125 lb (56.7 kg)     Height 06/12/23 2144 5\' 4"  (1.626 m)     Head Circumference --      Peak Flow --      Pain Score 06/12/23 2144 0     Pain Loc --  Pain Education --      Exclude from Growth Chart --     Most recent vital signs: Vitals:   06/13/23 0030 06/13/23 0100  BP: (!) 119/51 (!) 120/52  Pulse: (!) 51 (!) 52  Resp: 15 14  Temp:    SpO2: 100% 100%    CONSTITUTIONAL: Alert, responds appropriately to questions. Well-appearing; well-nourished HEAD: Normocephalic, atraumatic EYES: Conjunctivae clear, pupils appear equal, sclera nonicteric ENT: normal nose; moist mucous membranes NECK: Supple, normal ROM CARD: RRR; S1 and S2 appreciated RESP: Normal chest excursion without splinting or tachypnea; breath sounds clear and equal bilaterally; no wheezes, no rhonchi, no rales, no hypoxia or respiratory distress, speaking full sentences ABD/GI: Non-distended; soft, non-tender, no rebound, no guarding, no peritoneal signs, no significant tenderness over the gallbladder BACK: The back appears normal, no midline spinal tenderness or  step-off or deformity EXT: Normal ROM in all joints; no deformity noted, no edema, no calf tenderness or calf swelling SKIN: Normal color for age and race; warm; no rash on exposed skin NEURO: Moves all extremities equally, normal speech PSYCH: The patient's mood and manner are appropriate.   ED Results / Procedures / Treatments   LABS: (all labs ordered are listed, but only abnormal results are displayed) Labs Reviewed  BASIC METABOLIC PANEL  CBC  HEPATIC FUNCTION PANEL  LIPASE, BLOOD  TROPONIN I (HIGH SENSITIVITY)  TROPONIN I (HIGH SENSITIVITY)     EKG:  EKG Interpretation Date/Time:  Tuesday June 12 2023 21:43:44 EDT Ventricular Rate:  61 PR Interval:  130 QRS Duration:  88 QT Interval:  448 QTC Calculation: 450 R Axis:   45  Text Interpretation: Normal sinus rhythm Normal ECG No previous ECGs available Confirmed by Rochele Raring [phone]) on 06/13/2023 12:08:32 AM         RADIOLOGY: My personal review and interpretation of imaging: Chest x-ray clear.  I have personally reviewed all radiology reports.   DG Chest 1 View  Result Date: 06/12/2023 CLINICAL DATA:  [id number] Chest pain [id number] EXAM: CHEST  1 VIEW COMPARISON:  None Available. FINDINGS: The heart and mediastinal contours are within normal limits. No focal consolidation. No pulmonary edema. No pleural effusion. No pneumothorax. No acute osseous abnormality. IMPRESSION: No active disease. Electronically Signed   By: Tish Frederickson M.D.   On: 06/12/2023 22:26     PROCEDURES:  Critical Care performed: No     Procedures    IMPRESSION / MDM / ASSESSMENT AND PLAN / ED COURSE  I reviewed the triage vital signs and the nursing notes.    Patient here with chest pain that has resolved.     DIFFERENTIAL DIAGNOSIS (includes but not limited to):   ACS, less likely PE or dissection given pain resolved, GERD, gastritis, biliary colic, doubt cholangitis or cholecystitis, pancreatitis   Patient's  presentation is most consistent with acute presentation with potential threat to life or bodily function.   PLAN: Patient here with chest pain that has resolved.  Workup initiated from triage.  First troponin negative.  Second pending.  Normal electrolytes, renal function, hemoglobin.  EKG nonischemic.  Chest x-ray reviewed and interpreted by myself and the radiologist and is unremarkable.  No widened mediastinum or cardiomegaly present.  Will obtain second troponin and add on LFTs, lipase given upper abdominal pain.  Have offered right upper quadrant ultrasound for further evaluation of the upper abdominal pain that radiates into the back but patient declined stating that she will follow-up with her PCP.  Patient declines any  medication at this time.   MEDICATIONS GIVEN IN ED: Medications - No data to display   ED COURSE: Patient's second troponin is negative.  LFTs and lipase normal.  Patient reassured by findings today.  Have offered to place referral to cardiology given age and family history but patient states she would prefer to talk to her primary care doctor first.   At this time, I do not feel there is any life-threatening condition present. I reviewed all nursing notes, vitals, pertinent previous records.  All lab and urine results, EKGs, imaging ordered have been independently reviewed and interpreted by myself.  I reviewed all available radiology reports from any imaging ordered this visit.  Based on my assessment, I feel the patient is safe to be discharged home without further emergent workup and can continue workup as an outpatient as needed. Discussed all findings, treatment plan as well as usual and customary return precautions.  They verbalize understanding and are comfortable with this plan.  Outpatient follow-up has been provided as needed.  All questions have been answered.    CONSULTS:  none   OUTSIDE RECORDS REVIEWED: Reviewed last PCP note on  04/17/2023.       FINAL CLINICAL IMPRESSION(S) / ED DIAGNOSES   Final diagnoses:  Nonspecific chest pain     Rx / DC Orders   ED Discharge Orders     None        Note:  This document was prepared using Dragon voice recognition software and may include unintentional dictation errors.   Stafford Riviera, Layla Maw, DO 06/13/23 1553

## 2023-06-20 DIAGNOSIS — M810 Age-related osteoporosis without current pathological fracture: Secondary | ICD-10-CM | POA: Diagnosis not present

## 2023-07-16 DIAGNOSIS — M810 Age-related osteoporosis without current pathological fracture: Secondary | ICD-10-CM | POA: Diagnosis not present

## 2023-07-16 DIAGNOSIS — R0789 Other chest pain: Secondary | ICD-10-CM | POA: Diagnosis not present

## 2023-08-08 DIAGNOSIS — Z029 Encounter for administrative examinations, unspecified: Secondary | ICD-10-CM | POA: Diagnosis not present

## 2023-08-24 DIAGNOSIS — M6281 Muscle weakness (generalized): Secondary | ICD-10-CM | POA: Diagnosis not present

## 2023-10-04 DIAGNOSIS — M6281 Muscle weakness (generalized): Secondary | ICD-10-CM | POA: Diagnosis not present

## 2023-10-19 DIAGNOSIS — M6281 Muscle weakness (generalized): Secondary | ICD-10-CM | POA: Diagnosis not present

## 2024-02-07 DIAGNOSIS — K080 Exfoliation of teeth due to systemic causes: Secondary | ICD-10-CM | POA: Diagnosis not present

## 2024-02-12 DIAGNOSIS — D72819 Decreased white blood cell count, unspecified: Secondary | ICD-10-CM | POA: Diagnosis not present

## 2024-02-12 DIAGNOSIS — R7303 Prediabetes: Secondary | ICD-10-CM | POA: Diagnosis not present

## 2024-02-12 DIAGNOSIS — F331 Major depressive disorder, recurrent, moderate: Secondary | ICD-10-CM | POA: Diagnosis not present

## 2024-02-12 DIAGNOSIS — M810 Age-related osteoporosis without current pathological fracture: Secondary | ICD-10-CM | POA: Diagnosis not present

## 2024-02-12 DIAGNOSIS — E785 Hyperlipidemia, unspecified: Secondary | ICD-10-CM | POA: Diagnosis not present

## 2024-02-12 DIAGNOSIS — Z0000 Encounter for general adult medical examination without abnormal findings: Secondary | ICD-10-CM | POA: Diagnosis not present

## 2024-03-03 DIAGNOSIS — H25813 Combined forms of age-related cataract, bilateral: Secondary | ICD-10-CM | POA: Diagnosis not present

## 2024-03-03 DIAGNOSIS — H524 Presbyopia: Secondary | ICD-10-CM | POA: Diagnosis not present

## 2024-03-25 DIAGNOSIS — M810 Age-related osteoporosis without current pathological fracture: Secondary | ICD-10-CM | POA: Diagnosis not present

## 2024-06-03 DIAGNOSIS — Z124 Encounter for screening for malignant neoplasm of cervix: Secondary | ICD-10-CM | POA: Diagnosis not present

## 2024-06-03 DIAGNOSIS — Z1231 Encounter for screening mammogram for malignant neoplasm of breast: Secondary | ICD-10-CM | POA: Diagnosis not present

## 2024-06-03 DIAGNOSIS — M8580 Other specified disorders of bone density and structure, unspecified site: Secondary | ICD-10-CM | POA: Diagnosis not present

## 2024-06-03 DIAGNOSIS — Z1331 Encounter for screening for depression: Secondary | ICD-10-CM | POA: Diagnosis not present

## 2024-06-03 DIAGNOSIS — N952 Postmenopausal atrophic vaginitis: Secondary | ICD-10-CM | POA: Diagnosis not present

## 2024-06-12 DIAGNOSIS — K080 Exfoliation of teeth due to systemic causes: Secondary | ICD-10-CM | POA: Diagnosis not present

## 2024-06-24 DIAGNOSIS — M810 Age-related osteoporosis without current pathological fracture: Secondary | ICD-10-CM | POA: Diagnosis not present

## 2024-07-01 DIAGNOSIS — M810 Age-related osteoporosis without current pathological fracture: Secondary | ICD-10-CM | POA: Diagnosis not present

## 2024-08-19 DIAGNOSIS — M810 Age-related osteoporosis without current pathological fracture: Secondary | ICD-10-CM | POA: Diagnosis not present
